# Patient Record
Sex: Male | Born: 1955 | Race: White | Hispanic: No | Marital: Married | Smoking: Former smoker
Health system: Southern US, Community
[De-identification: ages and names within clinical notes are randomized; demographics above are authoritative.]

## PROBLEM LIST (undated history)

## (undated) DIAGNOSIS — H409 Unspecified glaucoma: Secondary | ICD-10-CM

## (undated) DIAGNOSIS — E785 Hyperlipidemia, unspecified: Secondary | ICD-10-CM

## (undated) HISTORY — PX: MELANOMA EXCISION: SHX5266

## (undated) HISTORY — DX: Unspecified glaucoma: H40.9

## (undated) HISTORY — PX: HIP SURGERY: SHX245

## (undated) HISTORY — DX: Hyperlipidemia, unspecified: E78.5

---

## 2002-08-04 ENCOUNTER — Ambulatory Visit (HOSPITAL_COMMUNITY): Admission: RE | Admit: 2002-08-04 | Discharge: 2002-08-04 | Payer: Self-pay | Admitting: Orthopedic Surgery

## 2002-08-04 ENCOUNTER — Encounter: Payer: Self-pay | Admitting: Orthopedic Surgery

## 2007-06-18 ENCOUNTER — Inpatient Hospital Stay (HOSPITAL_COMMUNITY): Admission: RE | Admit: 2007-06-18 | Discharge: 2007-06-21 | Payer: Self-pay | Admitting: Orthopedic Surgery

## 2007-09-26 DIAGNOSIS — C439 Malignant melanoma of skin, unspecified: Secondary | ICD-10-CM

## 2007-09-26 HISTORY — DX: Malignant melanoma of skin, unspecified: C43.9

## 2009-08-20 ENCOUNTER — Ambulatory Visit (HOSPITAL_COMMUNITY): Admission: RE | Admit: 2009-08-20 | Discharge: 2009-08-20 | Payer: Self-pay | Admitting: Orthopedic Surgery

## 2011-01-11 NOTE — Op Note (Signed)
Jason Villegas, Jason Villegas                 ACCOUNT NO.:  [id number]   MEDICAL RECORD NO.:  [id number]          PATIENT TYPE:  INP   LOCATION:  5037                         FACILITY:  [facility]   PHYSICIAN:  Feliberto Gottron. Turner Daniels, M.D.   DATE OF BIRTH:  Jan 24, 1956   DATE OF PROCEDURE:  06/18/2007  DATE OF DISCHARGE:                               OPERATIVE REPORT   PREOPERATIVE DIAGNOSIS:  End-stage arthritis, right hip.   POSTOPERATIVE DIAGNOSIS:  End-stage arthritis, right hip.   PROCEDURE:  Right total hip arthroplasty using DePuy components, a 56-mm  ASR acetabular shell, NK +0 49-mm Ultramet ball, 18 x 13 x 160 x 42 SROM  stem, 18-D large cone.   SURGEON:  Feliberto Gottron. Turner Daniels, M.D.   FIRST ASSISTANT:  Erskine Squibb B. Su Hilt, P.A.-C.   ANESTHETIC:  General endotracheal.   ESTIMATED BLOOD LOSS:  300 mL.   FLUID REPLACEMENT:  1500 mL of crystalloid.   DRAINS PLACED:  Foley catheter.   URINE OUTPUT:  300 mL.   INDICATIONS FOR PROCEDURE:  A 55 year old man with x-ray-proven bone-on-  bone arthritic changes of his right hip.  He has failed conservative  treatment, anti-inflammatory medicines, physical therapy, activity  modification.  Cortisone shot intra-articularly under x-ray control gave  him temporary relief.  He now desires elective right total hip  arthroplasty to decrease pain and increase function.  Risks and benefits  of surgery are well-known to the patient.  Questions have been answered  preoperatively.   DESCRIPTION OF PROCEDURE:  The patient identified by armband, taken to  the operating room at [facility], where the appropriate  anesthetic monitors were attached and general endotracheal anesthesia  induced with the patient in supine position.  Rolled into the left  lateral decubitus position, fixed there with a Stulberg Mark II pelvic  clamp, axillary roll was placed, well-padded.  He received 1 g of  vancomycin preoperatively because of a PENICILLIN allergy and then the  right  lower extremity prepped and draped in the usual sterile fashion  from the ankle to the hemipelvis.  Skin along the lateral hip and thigh  infiltrated with 20 mL of 0.5% Marcaine and epinephrine solution, and we  began the procedure by making a posterolateral approach to the hip joint  utilizing an incision centered over the greater trochanter about 15 cm  in length, cutting through the skin and subcutaneous tissue down to the  level of the iliotibial band, which was cut in line with the skin  incision, exposing the greater trochanter.  Small bleeders were  identified and cauterized with electrocautery.  A Cobra retractor was  placed between the gluteus minimus and the superior hip joint capsule  and a second Cobra between the quadratus femoris and the inferior hip  joint capsule.  This isolated the piriformis and short external  rotators, which were then cut off their insertion on the  intertrochanteric crest using the electrocautery, and they were tagged  with a #2 Ethibond suture.  The hip joint capsule was then developed  into an acetabular-based flap going from posterior-superior to posterior-  inferior, going across the femoral neck and creating a flap of tissue,  which was likewise tagged with two #2 Ethibond sutures.  This exposed  the arthritic femoral head.  The hip was then flexed and internally  rotated, dislocating the femoral head, and the standard neck cut was  performed one fingerbreadth above the lesser trochanter.  The head was  down to bone-on-bone arthritic changes and it appeared to be somewhat  dysplastic in nature with a relatively broad femoral head and a shallow  acetabular cup.  We then placed a Hohmann retractor on the anterior  column and levered the proximal femur anteriorly, placed posterior-  superior and -inferior wing retractors in the periacetabular region,  isolating the acetabulum and the labrum.  The labrum was then removed  with the electrocautery.   Bleeders down around the cotyloid notch were  also cauterized and a Cobra, spiked, was placed in the cotyloid notch  region.  We then reamed up to 55 mm basket reamer, obtaining good  coverage in all quadrants.  Satisfied with this position, we then  hammered into place a 56 ASR cup in 45 degrees of abduction and about 20  degrees of anteversion.  Peripheral osteophytes were then removed and  the wound was once again irrigated out with normal saline solution.  The  femur was then flexed and internally rotated, exposing the proximal end,  which was entered with a box-cutting chisel from the SROM set, followed  by the initiating reamer.  We then sequentially reamed up to a 13.5  axial reamer to the depth just distal to the greater trochanter.  We  then conically reamed up to an 18-D cone and the calcar was milled up to  an 18-D large calcar.  The 18-D large cone trial was then hammered into  place, followed by a trial 18 stem with an NK +0, 42 neck and a 49-mm  ball.  The hip was reduced, easily came to full extension, could not be  dislocated anteriorly in external rotation and in flexion.  He had to  flex to 90 with 70 of internal rotation before any instability was  noted.  At this point the trial components were removed.  An 18-D large  ZTT1 cone was hammered into place.  We then again reamed with a 13.5  axial reamer to the appropriate depth, followed by the real 18 x 13 x 42  x 160 stem in the same version as the cone.  Once the stem had been  seated, an NK +0 49-mm Ultramet ball was hammered into place, the hip  was once again reduced and stability noted to be excellent.  The wound  was again irrigated out with normal saline solution, the small bleeders  identified and cauterized, and at this point the capsular flap and short  external rotators were repaired back to the intertrochanteric crest  through drill holes.  The iliotibial band was closed with running #1  Vicryl suture, the  subcutaneous tissue with 0 and 2-0 undyed Vicryl  suture, and the skin with running interlocking 3-0 nylon suture.  A  dressing of Xeroform, 4x4 dressing sponges and Hypafix tape was then  applied.  The patient was unclamped, laid supine, awakened and taken to  the recovery room without difficulty.      Feliberto Gottron. Turner Daniels, M.D.  Electronically Signed     FJR/MEDQ  D:  06/18/2007  T:  06/19/2007  Job:  [id number]

## 2011-01-14 NOTE — Discharge Summary (Signed)
NAMEGAELEN, Jason Villegas                 ACCOUNT NO.:  [id number]   MEDICAL RECORD NO.:  [id number]          PATIENT TYPE:  INP   LOCATION:  5037                         FACILITY:  [facility]   PHYSICIAN:  Feliberto Gottron. Turner Daniels, M.D.   DATE OF BIRTH:  April 05, 1956   DATE OF ADMISSION:  06/18/2007  DATE OF DISCHARGE:  06/21/2007                               DISCHARGE SUMMARY   DISCHARGE DIAGNOSIS:  End-stage degenerative joint disease of the right  hip.   PROCEDURE PERFORMED:  Procedure while in hospital:  Right total hip  arthroplasty.   HISTORY OF PRESENT ILLNESS:  The patient is a 55 year old man with x-  rays proving bone-on-bone arthritic changes of the right hip.  He has  failed conservative treatment including anti-inflammatory medications,  physical therapy, activity modification, cortisone intraarticular  injection which did by the way give him excellent temporary relief.  He  has now desires elective right total hip arthroplasty to decrease pain  and  dysfunction.  Risks and benefits were discussed.  The patient  wishes to proceed with surgery.  He is allergic to penicillin and sulfa.  He is currently taking Cosopt, Vytorin and Restasis.   PAST MEDICAL HISTORY:  Usual childhood diseases.  Adult illnesses  degenerative joint disease and glaucoma.   PAST SURGICAL HISTORY:  Colonoscopy and wisdom teeth extraction.   SOCIAL HISTORY:  No tobacco, positive for ethanol 2-3 times per week, no  heavy drug abuse.  He is married, works in Audiological scientist.   FAMILY HISTORY:  Mother died at 59 with a history of cancer. Father died  with a history of stroke.   REVIEW OF SYSTEMS:  Positive for glasses; denies any shortness of  breath, chest pain or recent illness or injuries.   PHYSICAL EXAMINATION:  VITAL SIGNS:  The patient's temperature is 97.3,  pulse is 64, blood pressure 134/86.  He is 6 feet  295 pound male.  HEENT:  Head is normocephalic, atraumatic.  His pupils are equal, round,  reactive to  light and accommodation.  Nose is clear.  Throat is benign.  NECK:  Supple, full range of motion.  CHEST:  Clear to auscultation and percussion.  HEART:  Regular rate and rhythm.  ABDOMEN:  Soft, nontender.  EXTREMITIES:  Showed a right hip range of motion 10 to 15 degrees of  internal/external rotation each,  with positive foot tap, forward  flexion of 100 degrees with pain.  Otherwise neurovascularly intact.   X-rays showed bone on bone  changes of the hip.  Preoperative labs  including CBC, CMET, chest x-ray, EKG, PT and PTT were within normal  limits.   HOSPITAL COURSE:  Hospital course on the day of admission:  The patient  was taken to the operating room at [facility]  where he underwent a  right total hip arthroplasty using DePuy S-ROM components with 56 mm ASR  acetabular shell, an NK +0 49 mm Ultima ball, 18 x 13 x 160 x 42 S-ROM  stem with an 18 D large cone. The Foley catheter was placed  perioperatively.  He was placed on  Coumadin prophylactically  postoperatively with bridging Lovenox until he became therapeutic.  Physical therapy was begun on the first postoperative day.  He was  placed on a PCA Dilaudid pump for pain control.   On postoperative day #1, the patient was awake and alert, moderate pain,  no nausea or vomiting.  He was taking p.o. well.  Vital signs were  stable.  He was afebrile.  Wounds were clean and dry.  He is  neurovascularly intact.  Hemoglobin 13.1, PT 15.3. Urine output was 600  q.shift, otherwise stable.  Physical therapy continued.   It was felt that the patient would be able to go directly home with some  assistance.   On postoperative day #2, the patient was complaining of moderate pain.  He was afebrile with hemoglobin of 13.1, WBC of 11.4, INR of 2.0.  Dressing  was dry, the wound was benign,  otherwise stable.  Continue  with physical therapy where he was making rapid progress.   On postoperative day #3, the patient was awake and  alert, decreased  pain.  He was already walking the hallway, taking p.o.'s well.  Hemoglobin was 12.1, WBC of 10.4, hematocrit of 100.2.  The wound was  otherwise clean and dry.   ASSESSMENT:  Satisfactory postoperative course after right total hip  arthroplasty with elevated INR.   PLAN:  The patient will hold Coumadin dose today.  He has met his  physical therapy  goals and will be discharged home to the care of his  family as well as [employer].  We will monitor his home  Coumadin levels.  He will also be given prescriptions for Percocet and  Robaxin and he will restart his home medications as per the home  medication reconciliation sheet.  Diet is regular.  Activity is  weightbearing as tolerated, right lower extremity.  Total hip  precautions using a walker.  He is to return to see [place] in one week's  time for followup check or  sooner should he have any difficulties with  drainage of the wound, increased pain or pain accompanied by drainage.  At the time of discharge, he is medically stable and orthopedically  improved.   FINAL DIAGNOSES:  End-stage degenerative joint disease right hip.      Laural Benes. Jannet Mantis.      Feliberto Gottron. Turner Daniels, M.D.  Electronically Signed    JBR/MEDQ  D:  08/26/2007  T:  08/27/2007  Job:  [id number]

## 2011-06-08 LAB — CBC
HCT: 36.8 — ABNORMAL LOW
HCT: 37.4 — ABNORMAL LOW
HCT: 37.9 — ABNORMAL LOW
Hemoglobin: 12.6 — ABNORMAL LOW
Hemoglobin: 13.1
Hemoglobin: 13.1
MCHC: 34.5
MCHC: 35
MCV: 91
MCV: 92.5
Platelets: 137 — ABNORMAL LOW
Platelets: 154
RBC: 4.16 — ABNORMAL LOW
RDW: 12.8
RDW: 13.1
RDW: 13.1
WBC: 10.4
WBC: 12.2 — ABNORMAL HIGH

## 2011-06-09 LAB — URINALYSIS, ROUTINE W REFLEX MICROSCOPIC
Bilirubin Urine: NEGATIVE
Glucose, UA: NEGATIVE
Hgb urine dipstick: NEGATIVE
Specific Gravity, Urine: 1.017
pH: 6

## 2011-06-09 LAB — COMPREHENSIVE METABOLIC PANEL
AST: 25
BUN: 17
CO2: 29
Calcium: 9.7
Creatinine, Ser: 1.31
GFR calc Af Amer: >60
GFR calc non Af Amer: 58 — ABNORMAL LOW
Total Bilirubin: 0.9

## 2011-06-09 LAB — DIFFERENTIAL
Basophils Absolute: 0
Eosinophils Relative: 3
Lymphocytes Relative: 24
Lymphs Abs: 1.7
Neutro Abs: 4.5
Neutrophils Relative %: 63

## 2011-06-09 LAB — CBC
HCT: 46.7
MCHC: 34
MCV: 92.9
RBC: 5.03

## 2011-06-09 LAB — PROTIME-INR
INR: 1
Prothrombin Time: 13.7

## 2011-06-09 LAB — TYPE AND SCREEN: Antibody Screen: NEGATIVE

## 2011-06-09 LAB — ABO/RH: ABO/RH(D): O POS

## 2011-06-09 LAB — APTT: aPTT: 28

## 2012-12-10 ENCOUNTER — Ambulatory Visit (INDEPENDENT_AMBULATORY_CARE_PROVIDER_SITE_OTHER): Payer: BC Managed Care – PPO | Admitting: Family Medicine

## 2012-12-10 ENCOUNTER — Encounter: Payer: Self-pay | Admitting: Family Medicine

## 2012-12-10 VITALS — BP 123/80 | HR 73 | Ht 74.0 in | Wt 190.0 lb

## 2012-12-10 DIAGNOSIS — M25529 Pain in unspecified elbow: Secondary | ICD-10-CM

## 2012-12-10 DIAGNOSIS — M25521 Pain in right elbow: Secondary | ICD-10-CM

## 2012-12-10 NOTE — Patient Instructions (Addendum)
You have a Grade 1 extensor tendon strain at your elbow. Rest for 3-5 more days before starting the strengthening exercises (1 pound/soup can exercise, hammer exercise - 3 sets of 10 once a day of both). Aleve 2 tabs twice a day with food OR ibuprofen 3 tabs three times a day with food for pain and inflammation. Ok to take tylenol in addition to this. Ice elbow 15 minutes at a time 3-4 times a day. Compression sleeve may be beneficial but will also limit your motion at the elbow. After a week start easy hitting against a wall including 5-10 overhead hits - as long as pain is 1-2/10 you're ok to advance how many balls you hit each day without increasing risk of reinjury. Can consider formal physical therapy and/or nitro patches if not improving. Follow up with me in 4 weeks for reevaluation.

## 2012-12-11 ENCOUNTER — Encounter: Payer: Self-pay | Admitting: Family Medicine

## 2012-12-11 NOTE — Progress Notes (Signed)
  Subjective:    Patient ID: Jason Villegas, male    DOB: Jan 14, 1956, 57 y.o.   MRN: [id number]  PCP: Dr. Zachery Dauer  HPI 57 yo M here for right elbow pain.  Patient reports 2 days ago he was playing tennis. He went to do a 2 hand backhand and felt a pull near elbow laterally. Was able to continue playing. However when serving would hurt really bad (overhead). Has been icing, taking ibuprofen since. Has improved over past couple days. No prior injuries to right elbow. No swelling or bruising. Motions of wrist, brushing teeth bothers him at elbow. Right handed.  Past Medical History  Diagnosis Date  . Hyperlipidemia   . Glaucoma     No current outpatient prescriptions on file prior to visit.   No current facility-administered medications on file prior to visit.    Past Surgical History  Procedure Laterality Date  . Hip surgery Right     hip replacement  . Melanoma excision      Allergies  Allergen Reactions  . Penicillins   . Sulfa Antibiotics     History   Social History  . Marital Status: Married    Spouse Name: N/A    Number of Children: N/A  . Years of Education: N/A   Occupational History  . Not on file.   Social History Main Topics  . Smoking status: Never Smoker   . Smokeless tobacco: Not on file  . Alcohol Use: Not on file  . Drug Use: Not on file  . Sexually Active: Not on file   Other Topics Concern  . Not on file   Social History Narrative  . No narrative on file    Family History  Problem Relation Age of Onset  . Heart attack Father   . Hyperlipidemia Brother   . Hypertension Brother   . Diabetes Neg Hx   . Sudden death Neg Hx     BP 123/80  Pulse 73  Ht 6\' 2"  (1.88 m)  Wt 190 lb (86.183 kg)  BMI 24.38 kg/m2  Review of Systems See HPI above.    Objective:   Physical Exam Gen: NAD  R elbow: No gross deformity, swelling, bruising. TTP distal posterior to lateral epicondyle reproducing his pain.  No lateral epicondyle  tenderness.  No defect palpable in muscle.  No other TTP about elbow. FROM but with pain on 3rd finger extension, wrist extension.   Collateral ligaments intact. NVI distally.    Assessment & Plan:  1. Right elbow pain - consistent with common extensor tendon strain.  Treat similar to lateral epicondylitis but with a short period of rest initially.  Elbow sleeve for compression.  Icing, nsaids, tylenol.  Slowly return back to tennis (discussed return to play protocol after he's a week out from injury if pain < 3/10).  Home exercises demonstrated to start at that time as well.  F/u in 4 weeks for reevaluation. Consider formal PT, nitro patches if not improving as expected.

## 2012-12-12 DIAGNOSIS — M25521 Pain in right elbow: Secondary | ICD-10-CM | POA: Insufficient documentation

## 2012-12-12 NOTE — Assessment & Plan Note (Signed)
consistent with common extensor tendon strain.  Treat similar to lateral epicondylitis but with a short period of rest initially.  Elbow sleeve for compression.  Icing, nsaids, tylenol.  Slowly return back to tennis (discussed return to play protocol after he's a week out from injury if pain < 3/10).  Home exercises demonstrated to start at that time as well.  F/u in 4 weeks for reevaluation. Consider formal PT, nitro patches if not improving as expected.

## 2013-01-07 ENCOUNTER — Ambulatory Visit: Payer: BC Managed Care – PPO | Admitting: Family Medicine

## 2013-01-28 ENCOUNTER — Ambulatory Visit (INDEPENDENT_AMBULATORY_CARE_PROVIDER_SITE_OTHER): Payer: BC Managed Care – PPO | Admitting: Family Medicine

## 2013-01-28 ENCOUNTER — Encounter: Payer: Self-pay | Admitting: Family Medicine

## 2013-01-28 VITALS — BP 119/74 | HR 73 | Ht 74.0 in | Wt 185.0 lb

## 2013-01-28 DIAGNOSIS — M25529 Pain in unspecified elbow: Secondary | ICD-10-CM

## 2013-01-28 DIAGNOSIS — M25521 Pain in right elbow: Secondary | ICD-10-CM

## 2013-01-28 MED ORDER — NITROGLYCERIN 0.2 MG/HR TD PT24: MEDICATED_PATCH | TRANSDERMAL | Status: DC

## 2013-01-28 NOTE — Patient Instructions (Addendum)
Only use aleve as needed for pain and inflammation. Do home exercises (hammer, wrist extensions) 3 sets of 10 once a day. Ice elbow 15 minutes at a time 3-4 times a day as needed. Compression sleeve only with sports now. Use nitro patch - 1/4th of a patch and change this every day. Follow up with me in 6 weeks for reevaluation.

## 2013-01-29 ENCOUNTER — Encounter: Payer: Self-pay | Admitting: Family Medicine

## 2013-01-29 NOTE — Progress Notes (Signed)
  Subjective:    Patient ID: Jason Villegas, male    DOB: 1956-08-02, 57 y.o.   MRN: [id number]  PCP: Dr. Zachery Dauer  HPI  57 yo M here for f/u right elbow pain.  4/14: Patient reports 2 days ago he was playing tennis. He went to do a 2 hand backhand and felt a pull near elbow laterally. Was able to continue playing. However when serving would hurt really bad (overhead). Has been icing, taking ibuprofen since. Has improved over past couple days. No prior injuries to right elbow. No swelling or bruising. Motions of wrist, brushing teeth bothers him at elbow. Right handed.  6/2: Patient returns stating he feels 80% better with normal activities from last visit. Not doing exercises regularly. Does wear sleeve when playing tennis - felt worse with a one-handed backhand. Able to serve, hit other shots. Stopped taking aleve regularly. Has been icing.  Past Medical History  Diagnosis Date  . Hyperlipidemia   . Glaucoma     Current Outpatient Prescriptions on File Prior to Visit  Medication Sig Dispense Refill  . dorzolamide-timolol (COSOPT) 22.3-6.8 MG/ML ophthalmic solution 1 drop 2 (two) times daily.      Marland Kitchen ezetimibe-simvastatin (VYTORIN) 10-40 MG per tablet Take 1 tablet by mouth at bedtime.       No current facility-administered medications on file prior to visit.    Past Surgical History  Procedure Laterality Date  . Hip surgery Right     hip replacement  . Melanoma excision      Allergies  Allergen Reactions  . Penicillins   . Sulfa Antibiotics     History   Social History  . Marital Status: Married    Spouse Name: N/A    Number of Children: N/A  . Years of Education: N/A   Occupational History  . Not on file.   Social History Main Topics  . Smoking status: Never Smoker   . Smokeless tobacco: Not on file  . Alcohol Use: Not on file  . Drug Use: Not on file  . Sexually Active: Not on file   Other Topics Concern  . Not on file   Social History  Narrative  . No narrative on file    Family History  Problem Relation Age of Onset  . Heart attack Father   . Hyperlipidemia Brother   . Hypertension Brother   . Diabetes Neg Hx   . Sudden death Neg Hx     BP 119/74  Pulse 73  Ht 6\' 2"  (1.88 m)  Wt 185 lb (83.915 kg)  BMI 23.74 kg/m2  Review of Systems  See HPI above.    Objective:   Physical Exam  Gen: NAD  R elbow: No gross deformity, swelling, bruising. Very mild TTP distal posterior to lateral epicondyle reproducing his pain.  No lateral epicondyle tenderness.  No defect palpable in muscle.  No other TTP about elbow. FROM but with pain on 3rd finger extension, wrist extension.   Collateral ligaments intact. NVI distally.    Assessment & Plan:  1. Right elbow pain - consistent with common extensor tendon strain, tendinopathy.  Encouraged to do exercises daily as he hasn't been doing regularly.  Add nitro patches - discussed headache risk, adhesive reaction.  Elbow sleeve for compression.  Icing, nsaids, tylenol.  F/u in 6 weeks for reevaluation.

## 2013-01-29 NOTE — Assessment & Plan Note (Signed)
consistent with common extensor tendon strain, tendinopathy.  Encouraged to do exercises daily as he hasn't been doing regularly.  Add nitro patches - discussed headache risk, adhesive reaction.  Elbow sleeve for compression.  Icing, nsaids, tylenol.  F/u in 6 weeks for reevaluation.

## 2013-03-11 ENCOUNTER — Encounter: Payer: Self-pay | Admitting: Family Medicine

## 2013-03-11 ENCOUNTER — Ambulatory Visit (INDEPENDENT_AMBULATORY_CARE_PROVIDER_SITE_OTHER): Payer: BC Managed Care – PPO | Admitting: Family Medicine

## 2013-03-11 VITALS — BP 114/76 | HR 63 | Ht 74.0 in | Wt 189.0 lb

## 2013-03-11 DIAGNOSIS — M25529 Pain in unspecified elbow: Secondary | ICD-10-CM

## 2013-03-11 DIAGNOSIS — M25521 Pain in right elbow: Secondary | ICD-10-CM

## 2013-03-12 ENCOUNTER — Encounter: Payer: Self-pay | Admitting: Family Medicine

## 2013-03-12 NOTE — Patient Instructions (Addendum)
Discussed physical therapy, injection.  Wants to stick with home exercises and increase nitro patch to 1/2 patch.

## 2013-03-12 NOTE — Progress Notes (Signed)
Subjective:    Patient ID: Jason Villegas, male    DOB: July 17, 1956, 57 y.o.   MRN: [id number]  PCP: Dr. Zachery Dauer  HPI  57 yo M here for f/u right elbow pain.  4/14: Patient reports 2 days ago he was playing tennis. He went to do a 2 hand backhand and felt a pull near elbow laterally. Was able to continue playing. However when serving would hurt really bad (overhead). Has been icing, taking ibuprofen since. Has improved over past couple days. No prior injuries to right elbow. No swelling or bruising. Motions of wrist, brushing teeth bothers him at elbow. Right handed.  6/2: Patient returns stating he feels 80% better with normal activities from last visit. Not doing exercises regularly. Does wear sleeve when playing tennis - felt worse with a one-handed backhand. Able to serve, hit other shots. Stopped taking aleve regularly. Has been icing.  7/14: Patient reports he feels marginally better than last visit. Did home exercises more regularly for 3 1/2 weeks before trying tennis - bothered him a lot with this so stopped playing. Using nitro patches as well. No longer bothers him with daily activities (brushing teeth, opening doors).   Past Medical History  Diagnosis Date  . Hyperlipidemia   . Glaucoma     Current Outpatient Prescriptions on File Prior to Visit  Medication Sig Dispense Refill  . ciprofloxacin (CIPRO) 500 MG tablet       . diphenoxylate-atropine (LOMOTIL) 2.5-0.025 MG per tablet       . dorzolamide-timolol (COSOPT) 22.3-6.8 MG/ML ophthalmic solution 1 drop 2 (two) times daily.      Marland Kitchen ezetimibe-simvastatin (VYTORIN) 10-40 MG per tablet Take 1 tablet by mouth at bedtime.      . nitroGLYCERIN (NITRODUR - DOSED IN MG/24 HR) 0.2 mg/hr 1/4th patch over affected elbow - change daily  30 patch  1   No current facility-administered medications on file prior to visit.    Past Surgical History  Procedure Laterality Date  . Hip surgery Right     hip replacement  .  Melanoma excision      Allergies  Allergen Reactions  . Penicillins   . Sulfa Antibiotics     History   Social History  . Marital Status: Married    Spouse Name: N/A    Number of Children: N/A  . Years of Education: N/A   Occupational History  . Not on file.   Social History Main Topics  . Smoking status: Never Smoker   . Smokeless tobacco: Not on file  . Alcohol Use: Not on file  . Drug Use: Not on file  . Sexually Active: Not on file   Other Topics Concern  . Not on file   Social History Narrative  . No narrative on file    Family History  Problem Relation Age of Onset  . Heart attack Father   . Hyperlipidemia Brother   . Hypertension Brother   . Diabetes Neg Hx   . Sudden death Neg Hx     BP 114/76  Pulse 63  Ht 6\' 2"  (1.88 m)  Wt 189 lb (85.73 kg)  BMI 24.26 kg/m2  Review of Systems  See HPI above.    Objective:   Physical Exam  Gen: NAD  R elbow: No gross deformity, swelling, bruising. Mild TTP lateral epicondyle reproducing his pain.  No defect palpable in muscle.  No other TTP about elbow. FROM but with pain on 3rd finger extension, wrist extension.  Collateral ligaments intact. NVI distally.    Assessment & Plan:  1. Right elbow pain - pain location focal now, c/w lateral epicondylitis.  He would like to continue with home exercises, increase nitro patches to 1/2 patch (worked well for his wife who had same issue).  Declined injection, formal PT for now but will call us if he'd like to go forward with either of these.  Otherwise he plans to follow-up as needed.

## 2013-03-12 NOTE — Assessment & Plan Note (Signed)
pain location focal now, c/w lateral epicondylitis.  He would like to continue with home exercises, increase nitro patches to 1/2 patch (worked well for his wife who had same issue).  Declined injection, formal PT for now but will call us if he'd like to go forward with either of these.  Otherwise he plans to follow-up as needed.

## 2013-07-03 ENCOUNTER — Other Ambulatory Visit: Payer: Self-pay | Admitting: *Deleted

## 2013-07-03 ENCOUNTER — Other Ambulatory Visit: Payer: Self-pay | Admitting: Family Medicine

## 2013-07-03 DIAGNOSIS — M25521 Pain in right elbow: Secondary | ICD-10-CM

## 2013-07-03 MED ORDER — NITROGLYCERIN 0.2 MG/HR TD PT24: MEDICATED_PATCH | TRANSDERMAL | Status: DC

## 2013-07-04 ENCOUNTER — Other Ambulatory Visit: Payer: Self-pay

## 2014-03-19 ENCOUNTER — Ambulatory Visit (INDEPENDENT_AMBULATORY_CARE_PROVIDER_SITE_OTHER): Payer: BC Managed Care – PPO | Admitting: Family Medicine

## 2014-03-19 ENCOUNTER — Encounter: Payer: Self-pay | Admitting: Family Medicine

## 2014-03-19 VITALS — BP 133/81 | HR 73 | Ht 74.0 in | Wt 202.0 lb

## 2014-03-19 DIAGNOSIS — M25531 Pain in right wrist: Secondary | ICD-10-CM

## 2014-03-19 DIAGNOSIS — M25539 Pain in unspecified wrist: Secondary | ICD-10-CM

## 2014-03-19 NOTE — Patient Instructions (Signed)
You have extensor tendinitis of your right wrist. Icing 15 minutes at a time 3-4 times a day. Ibuprofen 600mg  three times a day with food OR aleve 2 tabs twice a day with food for pain and inflammation for at least 10 days then as needed. Wear wrist brace as often as possible over the next 6 weeks. Follow up with me as needed. I would recommend taking 2 weeks off from tennis in the meantime.

## 2014-03-20 ENCOUNTER — Encounter: Payer: Self-pay | Admitting: Family Medicine

## 2014-03-20 DIAGNOSIS — M25531 Pain in right wrist: Secondary | ICD-10-CM | POA: Insufficient documentation

## 2014-03-20 NOTE — Progress Notes (Signed)
Patient ID: Jason Villegas, male   DOB: 27-Mar-1956, 58 y.o.   MRN: [id number]  PCP: No primary provider on file.  Subjective:   HPI: Patient is a 58 y.o. male here for right wrist pain.  Patient reports he was playing tennis on 7/14. No acute injury with this and no pain during match. Developed pain after the match in dorsal wrist joint. No swelling or bruising. No prior injuries. Rested and this improved. Then on Monday playing tennis again pain worsened especially with forehand and snapping motions of wrist. Is right handed.  Past Medical History  Diagnosis Date  . Hyperlipidemia   . Glaucoma     Current Outpatient Prescriptions on File Prior to Visit  Medication Sig Dispense Refill  . ciprofloxacin (CIPRO) 500 MG tablet       . diphenoxylate-atropine (LOMOTIL) 2.5-0.025 MG per tablet       . dorzolamide-timolol (COSOPT) 22.3-6.8 MG/ML ophthalmic solution 1 drop 2 (two) times daily.      Marland Kitchen ezetimibe-simvastatin (VYTORIN) 10-40 MG per tablet Take 1 tablet by mouth at bedtime.      . nitroGLYCERIN (NITRODUR - DOSED IN MG/24 HR) 0.2 mg/hr patch 1/4th patch over affected elbow - change daily  30 patch  1   No current facility-administered medications on file prior to visit.    Past Surgical History  Procedure Laterality Date  . Hip surgery Right     hip replacement  . Melanoma excision      Allergies  Allergen Reactions  . Penicillins   . Sulfa Antibiotics     History   Social History  . Marital Status: Married    Spouse Name: N/A    Number of Children: N/A  . Years of Education: N/A   Occupational History  . Not on file.   Social History Main Topics  . Smoking status: Never Smoker   . Smokeless tobacco: Not on file  . Alcohol Use: Not on file  . Drug Use: Not on file  . Sexual Activity: Not on file   Other Topics Concern  . Not on file   Social History Narrative  . No narrative on file    Family History  Problem Relation Age of Onset  . Heart  attack Father   . Hyperlipidemia Brother   . Hypertension Brother   . Diabetes Neg Hx   . Sudden death Neg Hx     BP 133/81  Pulse 73  Ht 6\' 2"  (1.88 m)  Wt 202 lb (91.627 kg)  BMI 25.92 kg/m2  Review of Systems: See HPI above.    Objective:  Physical Exam:  Gen: NAD  Right wrist/hand: No gross deformity, swelling, bruising. TTP dorsal wrist over extensor tendons, not as much at wrist joint.  No tenderness at TFCC. FROM wrist with pain on resisted extension and pain on extension of digits. NVI distally    Assessment & Plan:  1. Right wrist pain - 2/2 extensor tendinitis.  No injury or exam findings to warrant radiographs.  Discussed icing, nsaids, wrist brace for next 6 weeks.  Discussed slow progression back to tennis as tolerated.  F/u prn.

## 2014-03-20 NOTE — Assessment & Plan Note (Signed)
2/2 extensor tendinitis.  No injury or exam findings to warrant radiographs.  Discussed icing, nsaids, wrist brace for next 6 weeks.  Discussed slow progression back to tennis as tolerated.  F/u prn.

## 2014-09-24 ENCOUNTER — Encounter: Payer: Self-pay | Admitting: Family Medicine

## 2014-09-24 ENCOUNTER — Ambulatory Visit (HOSPITAL_BASED_OUTPATIENT_CLINIC_OR_DEPARTMENT_OTHER)
Admission: RE | Admit: 2014-09-24 | Discharge: 2014-09-24 | Disposition: A | Discharge status: Home / Self Care | Payer: BLUE CROSS/BLUE SHIELD | Source: Ambulatory Visit | Attending: Family Medicine | Admitting: Family Medicine

## 2014-09-24 ENCOUNTER — Ambulatory Visit (INDEPENDENT_AMBULATORY_CARE_PROVIDER_SITE_OTHER): Payer: BLUE CROSS/BLUE SHIELD | Admitting: Family Medicine

## 2014-09-24 VITALS — BP 117/76 | HR 99 | Ht 74.0 in | Wt 205.0 lb

## 2014-09-24 DIAGNOSIS — M25531 Pain in right wrist: Secondary | ICD-10-CM | POA: Diagnosis not present

## 2014-09-24 NOTE — Patient Instructions (Signed)
Get x-rays of your wrist as you leave today. We will call you with these results. Assuming these are normal, I would recommend doing an MRI arthrogram of the wrist to assess for a TFCC tear or synovial cyst.

## 2014-09-29 NOTE — Assessment & Plan Note (Signed)
concerning that he's continued to struggle with wrist pain for 7 months and not improved despite rest, wrist brace as would expect from  extensor tendinitis.  Radiographs negative - will go ahead with MR arthrogram to assess for TFCC tear.

## 2014-09-29 NOTE — Progress Notes (Addendum)
Patient ID: Jason Villegas, male   DOB: September 20, 1955, 59 y.o.   MRN: [id number]  PCP: No primary care provider on file.  Subjective:   HPI: Patient is a 59 y.o. male here for right wrist pain.  03/19/14: Patient reports he was playing tennis on 7/14. No acute injury with this and no pain during match. Developed pain after the match in dorsal wrist joint. No swelling or bruising. No prior injuries. Rested and this improved. Then on Monday playing tennis again pain worsened especially with forehand and snapping motions of wrist. Is right handed.  09/24/14: Patient reports he's continued to struggle with right wrist pain despite extensive rest. Used wrist brace. Flexion feels limited. Also can't bend wrist back very much. No new injuries or trauma.  Past Medical History  Diagnosis Date  . Hyperlipidemia   . Glaucoma     Current Outpatient Prescriptions on File Prior to Visit  Medication Sig Dispense Refill  . diphenoxylate-atropine (LOMOTIL) 2.5-0.025 MG per tablet     . dorzolamide-timolol (COSOPT) 22.3-6.8 MG/ML ophthalmic solution 1 drop 2 (two) times daily.     No current facility-administered medications on file prior to visit.    Past Surgical History  Procedure Laterality Date  . Hip surgery Right     hip replacement  . Melanoma excision      Allergies  Allergen Reactions  . Penicillins   . Sulfa Antibiotics     History   Social History  . Marital Status: Married    Spouse Name: N/A    Number of Children: N/A  . Years of Education: N/A   Occupational History  . Not on file.   Social History Main Topics  . Smoking status: Never Smoker   . Smokeless tobacco: Not on file  . Alcohol Use: Not on file  . Drug Use: Not on file  . Sexual Activity: Not on file   Other Topics Concern  . Not on file   Social History Narrative    Family History  Problem Relation Age of Onset  . Heart attack Father   . Hyperlipidemia Brother   . Hypertension Brother    . Diabetes Neg Hx   . Sudden death Neg Hx     BP 117/76 mmHg  Pulse 99  Ht 6\' 2"  (1.88 m)  Wt 205 lb (92.987 kg)  BMI 26.31 kg/m2  Review of Systems: See HPI above.    Objective:  Physical Exam:  Gen: NAD  Right wrist/hand: No gross deformity, swelling, bruising. TTP dorsal wrist mildly over extensor tendons, TFCC.  No other tenderness. Does lack about 10 degrees extension now - worse than last visit.  Pain resisted extension of wrist but not digits. NVI distally.    Assessment & Plan:  1. Right wrist pain - concerning that he's continued to struggle with wrist pain for 7 months and not improved despite rest, wrist brace as would expect from  extensor tendinitis.  Radiographs negative - will go ahead with MR arthrogram to assess for TFCC tear.  Addendum:  MR arthrogram of right wrist reviewed and discussed with patient.  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  Uncertain if these are large enough to warrant surgical intervention - advised we go ahead with hand surgery referral for their input on continued conservative treatment, occupational therapy vs operative intervention.

## 2014-10-10 ENCOUNTER — Ambulatory Visit
Admission: RE | Admit: 2014-10-10 | Discharge: 2014-10-10 | Disposition: A | Discharge status: Home / Self Care | Payer: BLUE CROSS/BLUE SHIELD | Source: Ambulatory Visit | Attending: Family Medicine | Admitting: Family Medicine

## 2014-10-10 DIAGNOSIS — M25531 Pain in right wrist: Secondary | ICD-10-CM

## 2014-10-10 MED ORDER — IOHEXOL 180 MG/ML  SOLN
1.5000 mL | Freq: Once | INTRAMUSCULAR | Status: AC | PRN
  Administered 2014-10-10: 1.5 mL via INTRA_ARTICULAR

## 2014-10-16 NOTE — Addendum Note (Signed)
Addended by: Sherrie George F on: 10/16/2014 09:22 AM   Modules accepted: Orders

## 2014-11-10 ENCOUNTER — Telehealth: Payer: Self-pay | Admitting: Family Medicine

## 2014-11-10 NOTE — Telephone Encounter (Signed)
Spoke to patient and informed him that [facility] can view x-rays/MRIs through the [facility]. Also informed patient that if he wanted to he could come by the radiology department and pick up a CD. Patient stated he would come in to pick up CD.

## 2015-12-02 DIAGNOSIS — J029 Acute pharyngitis, unspecified: Secondary | ICD-10-CM | POA: Diagnosis not present

## 2015-12-02 DIAGNOSIS — L309 Dermatitis, unspecified: Secondary | ICD-10-CM | POA: Diagnosis not present

## 2016-02-17 DIAGNOSIS — Z85820 Personal history of malignant melanoma of skin: Secondary | ICD-10-CM | POA: Diagnosis not present

## 2016-02-17 DIAGNOSIS — L309 Dermatitis, unspecified: Secondary | ICD-10-CM | POA: Diagnosis not present

## 2016-02-17 DIAGNOSIS — D239 Other benign neoplasm of skin, unspecified: Secondary | ICD-10-CM | POA: Diagnosis not present

## 2016-07-13 IMAGING — MR MR WRIST*R* W/ CM
4 of 5 series · 21 of 40 positions shown · IV contrast (agent unspecified)
Comparison: None.

CLINICAL DATA: Right wrist pain since playing tennis 7 months ago.

EXAM:
MRI OF THE RIGHT WRIST WITH CONTRAST(MR Arthrogram)
TECHNIQUE: Multiplanar, multisequence MR imaging of the wrist was performed
immediately following contrast injection into the radiocarpal joint
under fluoroscopic guidance. No intravenous contrast was
administered.

[Series 4: T2 fat-sat · axial · 3.5mm · 0.23mm/px · z∈[-59,+14]mm · 8 of 20 slices shown (1 of 2)]
[im 1/20]
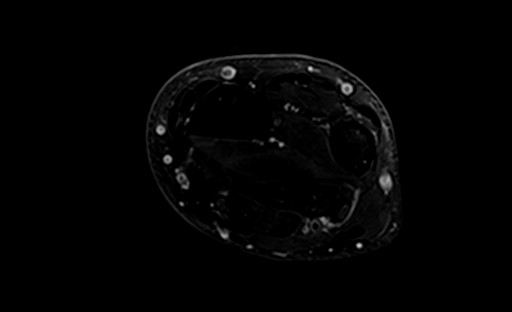
[im 3/20]
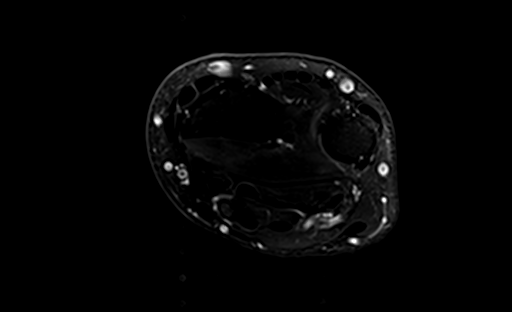
[im 7/20]
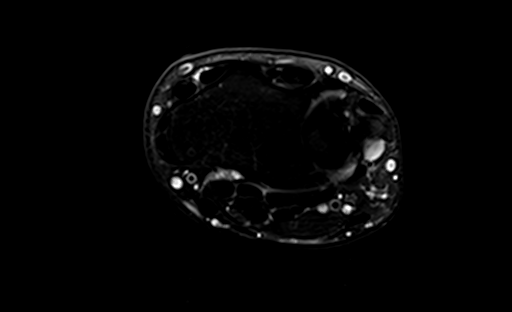
[im 9/20]
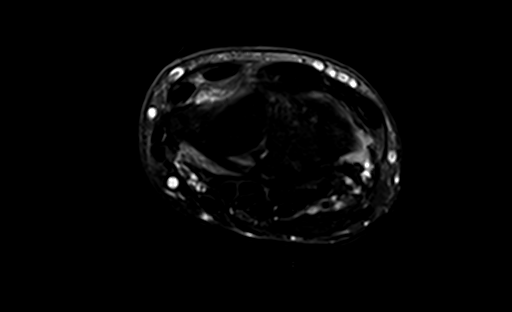
[im 11/20]
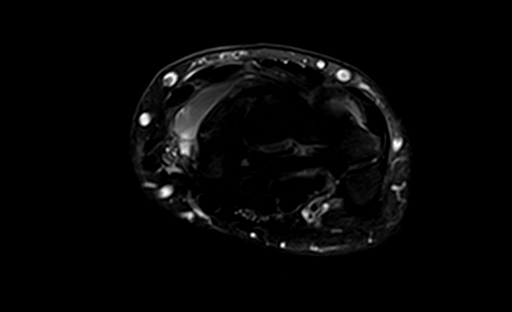
[im 13/20]
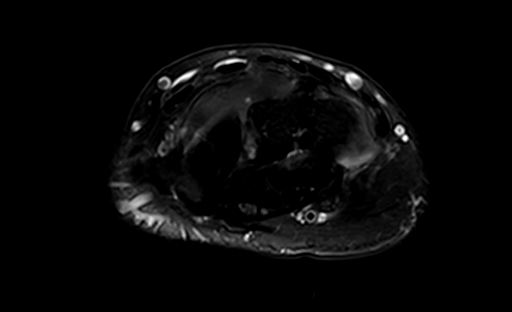
[im 17/20]
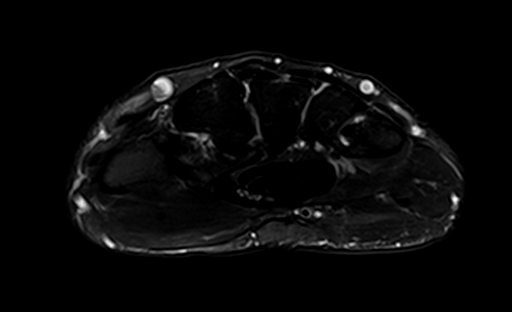
[im 20/20]
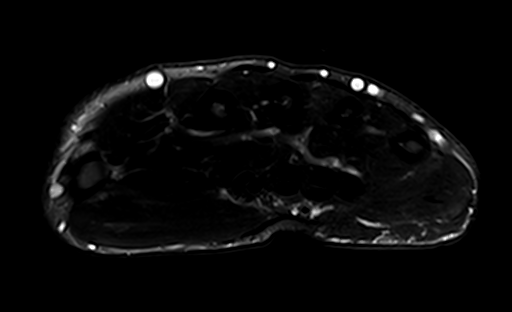

[Series 5: T1 fat-sat · coronal · 3.0mm · 0.23mm/px · 3 of 14 slices shown]
[im 3/14]
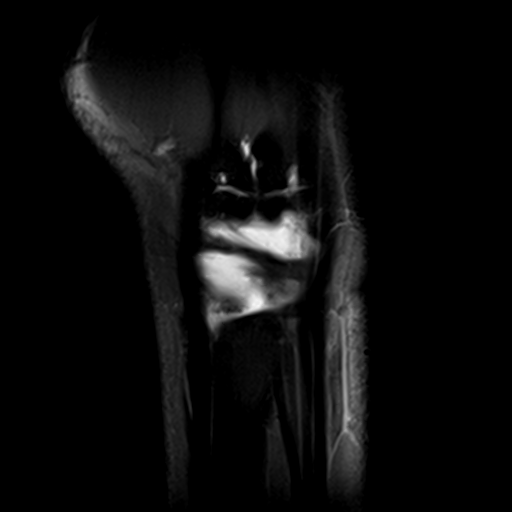
[im 7/14]
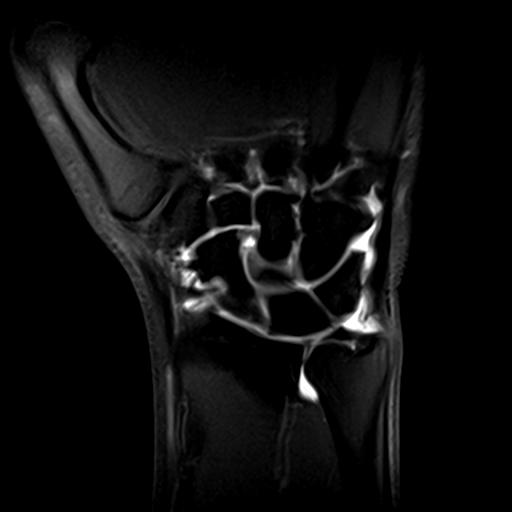
[im 11/14]
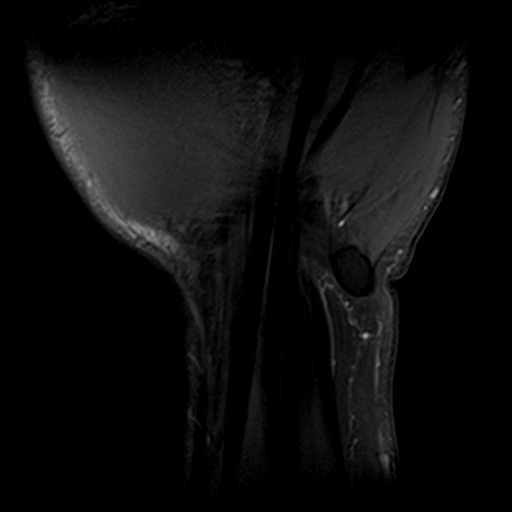

[Series 7: T1 · coronal · 3.0mm · 0.23mm/px · 3 of 14 slices shown]
[im 3/14]
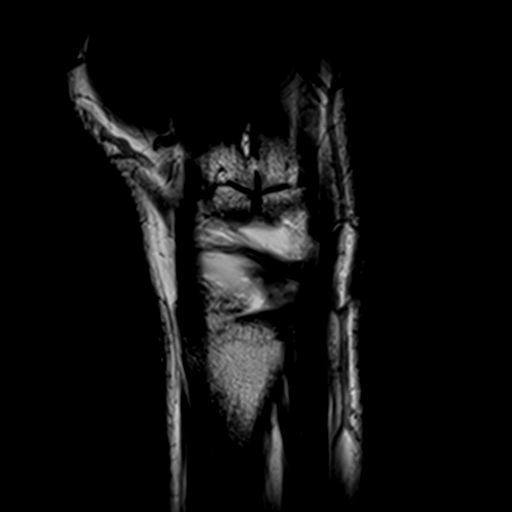
[im 7/14]
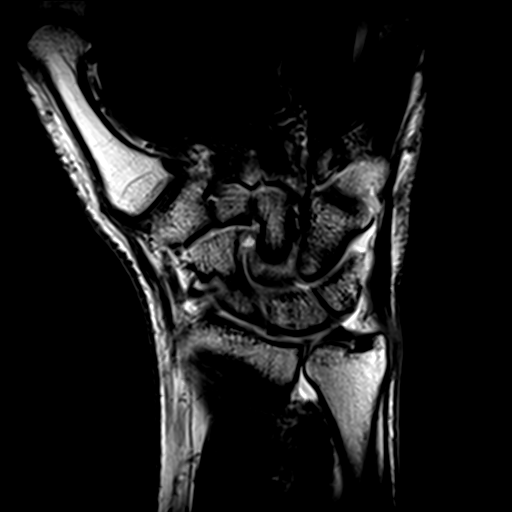
[im 11/14]
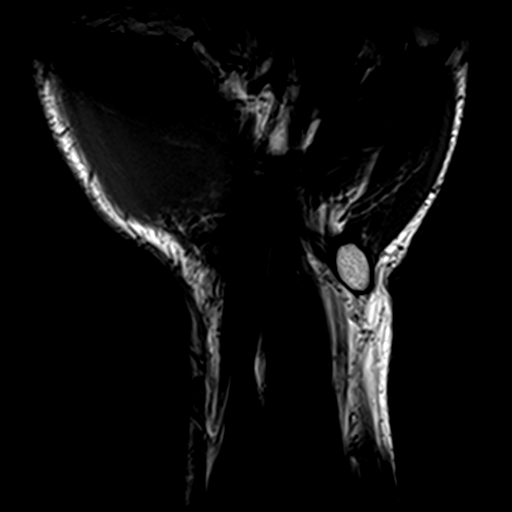

[Series 10: T2 fat-sat · coronal · 3.0mm · 0.23mm/px · 7 of 14 slices shown (2 of 2)]
[im 1/14]
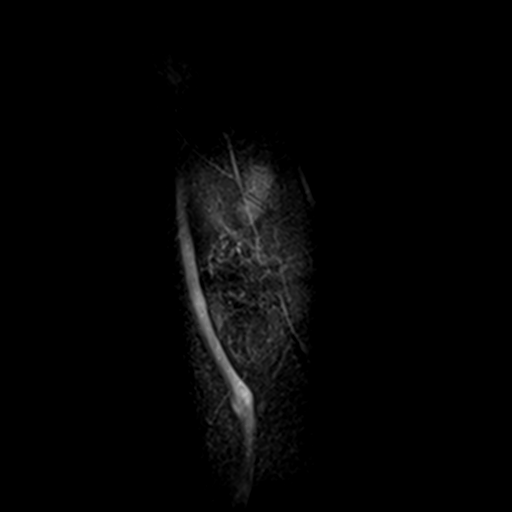
[im 3/14]
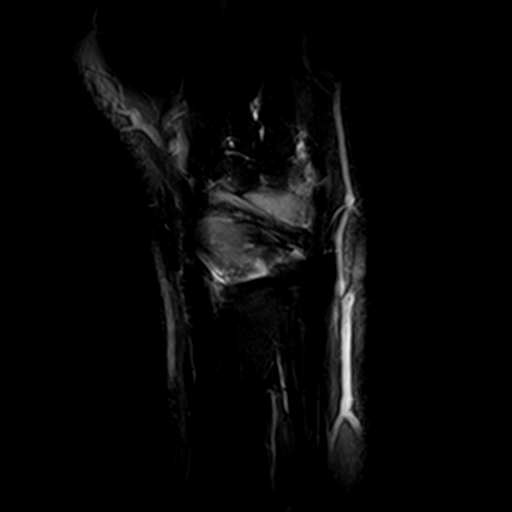
[im 5/14]
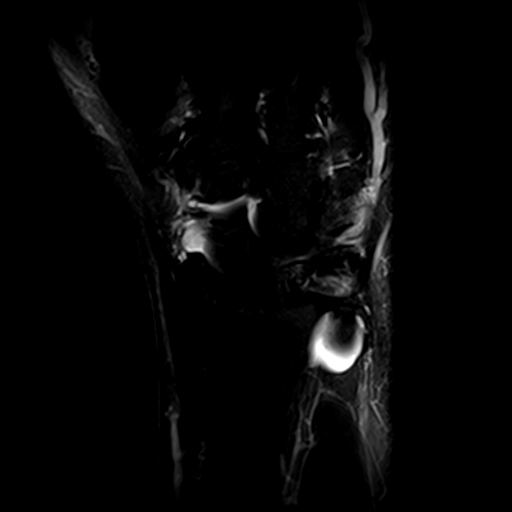
[im 7/14]
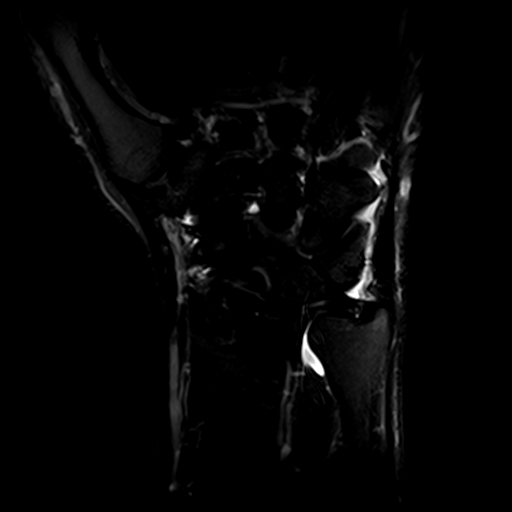
[im 9/14]
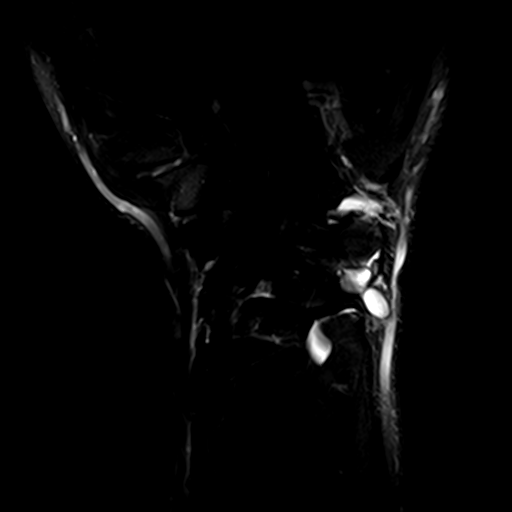
[im 11/14]
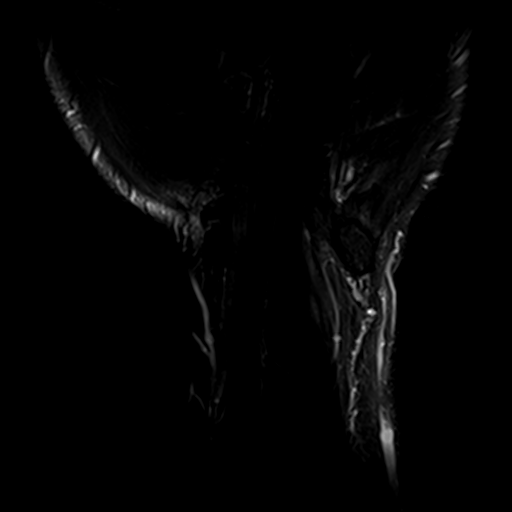
[im 14/14]
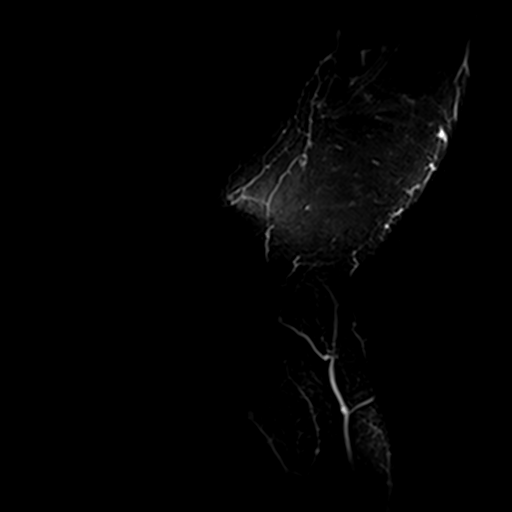

[21 of 40 positions shown; findings below may reference images not displayed]

FINDINGS: Alignment
Ulnar variance:  None

Distal radioulnar joint Congruent.

Carpal instability: Dorsal tilting of the lunate which may be
positional versus secondary to DISI.

Fluid
Carpus effusion: Arthrographic contrast is present within the
radiocarpal joint, midcarpal joint and distal radioulnar joint.

Intrinsic ligaments
Scapholunate ligament: Small perforation of the central portion of
the scapholunate ligament. Dorsal and volar bands of the
scapholunate ligament are intact.

Lunotriquetral ligament:  Intact.

Triangular fibrocartilage: Small central perforation through the
central portion of the articular disc.

Lunate facet:  No

Extensor compartment
I:  Normal.

II:  Normal.

III:  Normal.

IV:  Normal.

V:  Normal.

VI:  Normal.

Flexor compartment
Carpal tunnel:  Normal.

Median nerve:  Normal.

Flexor retinaculum:  Normal.

Flexor tendons:  Normal.

[REDACTED]:  Normal.

Articulations
Thumb carpometacarpal joint:  Normal.

Scaphotrapezotrapezoidal joint:  Normal.

Pisiform-triquetral joint:  Normal.

Other findings
Bones:  Mild osteoarthritis of the first CMC joint.

Muscles:  Normal.

Vessels:  Normal.
IMPRESSION: 1. Small central perforation through the central portion of the
articular disc of the triangular fibrocartilage complex.
2. Small perforation of the central portion of the scapholunate
ligament. The dorsal and volar bands of the scapholunate ligament
are intact.
3. Dorsal tilting of the lunate which may be positional versus
secondary to DISI.

## 2017-03-29 DIAGNOSIS — H401111 Primary open-angle glaucoma, right eye, mild stage: Secondary | ICD-10-CM | POA: Diagnosis not present

## 2017-03-29 DIAGNOSIS — H5213 Myopia, bilateral: Secondary | ICD-10-CM | POA: Diagnosis not present

## 2017-07-11 DIAGNOSIS — H401131 Primary open-angle glaucoma, bilateral, mild stage: Secondary | ICD-10-CM | POA: Diagnosis not present

## 2017-10-31 DIAGNOSIS — N529 Male erectile dysfunction, unspecified: Secondary | ICD-10-CM | POA: Diagnosis not present

## 2017-10-31 DIAGNOSIS — R7301 Impaired fasting glucose: Secondary | ICD-10-CM | POA: Diagnosis not present

## 2017-10-31 DIAGNOSIS — E7800 Pure hypercholesterolemia, unspecified: Secondary | ICD-10-CM | POA: Diagnosis not present

## 2017-10-31 DIAGNOSIS — E291 Testicular hypofunction: Secondary | ICD-10-CM | POA: Diagnosis not present

## 2018-01-11 DIAGNOSIS — H5213 Myopia, bilateral: Secondary | ICD-10-CM | POA: Diagnosis not present

## 2018-07-20 DIAGNOSIS — E7800 Pure hypercholesterolemia, unspecified: Secondary | ICD-10-CM | POA: Diagnosis not present

## 2018-07-23 DIAGNOSIS — R7301 Impaired fasting glucose: Secondary | ICD-10-CM | POA: Diagnosis not present

## 2018-07-23 DIAGNOSIS — E7800 Pure hypercholesterolemia, unspecified: Secondary | ICD-10-CM | POA: Diagnosis not present

## 2018-07-23 DIAGNOSIS — R739 Hyperglycemia, unspecified: Secondary | ICD-10-CM | POA: Diagnosis not present

## 2018-07-23 DIAGNOSIS — N529 Male erectile dysfunction, unspecified: Secondary | ICD-10-CM | POA: Diagnosis not present

## 2018-11-21 DIAGNOSIS — M25552 Pain in left hip: Secondary | ICD-10-CM | POA: Diagnosis not present

## 2019-01-01 DIAGNOSIS — Z471 Aftercare following joint replacement surgery: Secondary | ICD-10-CM | POA: Diagnosis not present

## 2019-01-01 DIAGNOSIS — Z96642 Presence of left artificial hip joint: Secondary | ICD-10-CM | POA: Diagnosis not present

## 2019-01-01 DIAGNOSIS — M1612 Unilateral primary osteoarthritis, left hip: Secondary | ICD-10-CM | POA: Diagnosis not present

## 2019-01-01 DIAGNOSIS — E785 Hyperlipidemia, unspecified: Secondary | ICD-10-CM | POA: Diagnosis not present

## 2019-01-01 DIAGNOSIS — M6281 Muscle weakness (generalized): Secondary | ICD-10-CM | POA: Diagnosis not present

## 2019-01-01 DIAGNOSIS — R262 Difficulty in walking, not elsewhere classified: Secondary | ICD-10-CM | POA: Diagnosis not present

## 2019-01-01 DIAGNOSIS — M1712 Unilateral primary osteoarthritis, left knee: Secondary | ICD-10-CM | POA: Diagnosis not present

## 2019-01-03 DIAGNOSIS — Z471 Aftercare following joint replacement surgery: Secondary | ICD-10-CM | POA: Diagnosis not present

## 2019-01-03 DIAGNOSIS — Z96642 Presence of left artificial hip joint: Secondary | ICD-10-CM | POA: Diagnosis not present

## 2019-01-03 DIAGNOSIS — R600 Localized edema: Secondary | ICD-10-CM | POA: Diagnosis not present

## 2019-01-03 DIAGNOSIS — M25652 Stiffness of left hip, not elsewhere classified: Secondary | ICD-10-CM | POA: Diagnosis not present

## 2019-01-08 DIAGNOSIS — M25652 Stiffness of left hip, not elsewhere classified: Secondary | ICD-10-CM | POA: Diagnosis not present

## 2019-01-08 DIAGNOSIS — Z96642 Presence of left artificial hip joint: Secondary | ICD-10-CM | POA: Diagnosis not present

## 2019-01-08 DIAGNOSIS — R600 Localized edema: Secondary | ICD-10-CM | POA: Diagnosis not present

## 2019-01-08 DIAGNOSIS — Z471 Aftercare following joint replacement surgery: Secondary | ICD-10-CM | POA: Diagnosis not present

## 2019-01-15 DIAGNOSIS — R600 Localized edema: Secondary | ICD-10-CM | POA: Diagnosis not present

## 2019-01-15 DIAGNOSIS — Z471 Aftercare following joint replacement surgery: Secondary | ICD-10-CM | POA: Diagnosis not present

## 2019-01-15 DIAGNOSIS — Z96642 Presence of left artificial hip joint: Secondary | ICD-10-CM | POA: Diagnosis not present

## 2019-01-15 DIAGNOSIS — M25652 Stiffness of left hip, not elsewhere classified: Secondary | ICD-10-CM | POA: Diagnosis not present

## 2019-01-24 DIAGNOSIS — Z96642 Presence of left artificial hip joint: Secondary | ICD-10-CM | POA: Diagnosis not present

## 2019-01-24 DIAGNOSIS — R600 Localized edema: Secondary | ICD-10-CM | POA: Diagnosis not present

## 2019-01-24 DIAGNOSIS — M25652 Stiffness of left hip, not elsewhere classified: Secondary | ICD-10-CM | POA: Diagnosis not present

## 2019-01-24 DIAGNOSIS — Z471 Aftercare following joint replacement surgery: Secondary | ICD-10-CM | POA: Diagnosis not present

## 2019-02-05 DIAGNOSIS — R600 Localized edema: Secondary | ICD-10-CM | POA: Diagnosis not present

## 2019-02-05 DIAGNOSIS — Z96642 Presence of left artificial hip joint: Secondary | ICD-10-CM | POA: Diagnosis not present

## 2019-02-05 DIAGNOSIS — Z471 Aftercare following joint replacement surgery: Secondary | ICD-10-CM | POA: Diagnosis not present

## 2019-02-05 DIAGNOSIS — M25652 Stiffness of left hip, not elsewhere classified: Secondary | ICD-10-CM | POA: Diagnosis not present

## 2019-02-07 DIAGNOSIS — Z96642 Presence of left artificial hip joint: Secondary | ICD-10-CM | POA: Diagnosis not present

## 2019-03-27 DIAGNOSIS — Z96641 Presence of right artificial hip joint: Secondary | ICD-10-CM | POA: Diagnosis not present

## 2019-04-18 DIAGNOSIS — Z96642 Presence of left artificial hip joint: Secondary | ICD-10-CM | POA: Diagnosis not present

## 2019-04-18 DIAGNOSIS — M25552 Pain in left hip: Secondary | ICD-10-CM | POA: Diagnosis not present

## 2019-04-24 DIAGNOSIS — M8949 Other hypertrophic osteoarthropathy, multiple sites: Secondary | ICD-10-CM | POA: Diagnosis not present

## 2019-04-24 DIAGNOSIS — N5201 Erectile dysfunction due to arterial insufficiency: Secondary | ICD-10-CM | POA: Diagnosis not present

## 2019-04-24 DIAGNOSIS — E782 Mixed hyperlipidemia: Secondary | ICD-10-CM | POA: Diagnosis not present

## 2019-04-24 DIAGNOSIS — Z125 Encounter for screening for malignant neoplasm of prostate: Secondary | ICD-10-CM | POA: Diagnosis not present

## 2019-07-09 DIAGNOSIS — M25552 Pain in left hip: Secondary | ICD-10-CM | POA: Diagnosis not present

## 2019-07-09 DIAGNOSIS — Z96642 Presence of left artificial hip joint: Secondary | ICD-10-CM | POA: Diagnosis not present

## 2019-07-16 DIAGNOSIS — I8290 Acute embolism and thrombosis of unspecified vein: Secondary | ICD-10-CM | POA: Diagnosis not present

## 2019-07-16 DIAGNOSIS — M79604 Pain in right leg: Secondary | ICD-10-CM | POA: Diagnosis not present

## 2019-07-16 DIAGNOSIS — M79674 Pain in right toe(s): Secondary | ICD-10-CM | POA: Diagnosis not present

## 2019-07-16 DIAGNOSIS — I8001 Phlebitis and thrombophlebitis of superficial vessels of right lower extremity: Secondary | ICD-10-CM | POA: Diagnosis not present

## 2019-07-16 DIAGNOSIS — M79671 Pain in right foot: Secondary | ICD-10-CM | POA: Diagnosis not present

## 2019-07-16 DIAGNOSIS — M7989 Other specified soft tissue disorders: Secondary | ICD-10-CM | POA: Diagnosis not present

## 2019-07-16 DIAGNOSIS — M19071 Primary osteoarthritis, right ankle and foot: Secondary | ICD-10-CM | POA: Diagnosis not present

## 2019-07-17 DIAGNOSIS — S85301A Unspecified injury of greater saphenous vein at lower leg level, right leg, initial encounter: Secondary | ICD-10-CM | POA: Diagnosis not present

## 2019-07-17 DIAGNOSIS — I82811 Embolism and thrombosis of superficial veins of right lower extremities: Secondary | ICD-10-CM | POA: Diagnosis not present

## 2019-07-17 DIAGNOSIS — M79604 Pain in right leg: Secondary | ICD-10-CM | POA: Diagnosis not present

## 2020-02-04 ENCOUNTER — Encounter: Payer: Self-pay | Admitting: *Deleted

## 2020-02-05 ENCOUNTER — Other Ambulatory Visit: Payer: Self-pay

## 2020-02-05 ENCOUNTER — Encounter: Payer: Self-pay | Admitting: Dermatology

## 2020-02-05 ENCOUNTER — Ambulatory Visit (INDEPENDENT_AMBULATORY_CARE_PROVIDER_SITE_OTHER): Payer: BLUE CROSS/BLUE SHIELD | Admitting: Dermatology

## 2020-02-05 DIAGNOSIS — D485 Neoplasm of uncertain behavior of skin: Secondary | ICD-10-CM | POA: Diagnosis not present

## 2020-02-05 DIAGNOSIS — D229 Melanocytic nevi, unspecified: Secondary | ICD-10-CM

## 2020-02-05 DIAGNOSIS — L821 Other seborrheic keratosis: Secondary | ICD-10-CM | POA: Diagnosis not present

## 2020-02-05 DIAGNOSIS — D1801 Hemangioma of skin and subcutaneous tissue: Secondary | ICD-10-CM

## 2020-02-05 DIAGNOSIS — Z1283 Encounter for screening for malignant neoplasm of skin: Secondary | ICD-10-CM | POA: Diagnosis not present

## 2020-02-05 DIAGNOSIS — D225 Melanocytic nevi of trunk: Secondary | ICD-10-CM

## 2020-02-05 HISTORY — DX: Melanocytic nevi, unspecified: D22.9

## 2020-02-05 NOTE — Patient Instructions (Addendum)
Biopsy, Surgery (Curettage) & Surgery (Excision) Aftercare Instructions  1. Okay to remove bandage in 24 hours  2. Wash area with soap and water  3. Apply Vaseline to area twice daily until healed (Not Neosporin)  4. Okay to cover with a Band-Aid to decrease the chance of infection or prevent irritation from clothing; also it's okay to uncover lesion at home.  5. Suture instructions: return to our office in 7-10 or 10-14 days for a nurse visit for suture removal. Variable healing with sutures, if pain or itching occurs call our office. It's okay to shower or bathe 24 hours after sutures are given.  6. The following risks may occur after a biopsy, curettage or excision: bleeding, scarring, discoloration, recurrence, infection (redness, yellow drainage, pain or swelling).  7. For questions, concerns and results call our office at [place] before 4pm & Friday before 3pm. Biopsy results will be available in 1 week.  Routine follow-up(first in over three years)  for ARCHIE SHEA date of birth May 27, 1956.  The melanoma scar on his left upper arm is perfectly clear and there is no regional increase in lymph nodes.  Skin examined from his legs up to his scalp.  There is 1 bi-chromic irregular mole on the left lower back which shows some atypia with dermoscopy.  Shave biopsy obtained on this and he could check on his home MyChart on Friday for the result or he can call my office.  He is scheduled to travel to the beach and a pre-warned him about irritation from his back rubbing on the car seat so a simple bandage may minimize irritation.  My cell phone is [phone] if there is any problem in which he needs to contact me.  Additionally he has 3 types of common benign spots.  On rub areas like the neck he has little stuck out bumps called skin tags which require no intervention.  He has multiple tan textured keratoses including spot on the right sideburn.  In the right sideburn and on the upper  frontal hairline are similar rough spots which are more likely warts than keratoses.  These are currently of no concern or bother to Mr. Wolfinger so no treatment initiated.  Finally he has many dozen 2 to 3 mm red dots, larger ones on the right front thigh, which are benign cherry angiomas and do not require watching her removal.  If the microscopic on his mole indicates no need for further therapy, I will do an annual skin check on Mr. Klarich

## 2020-02-11 ENCOUNTER — Telehealth: Payer: Self-pay | Admitting: *Deleted

## 2020-02-11 ENCOUNTER — Encounter: Payer: Self-pay | Admitting: *Deleted

## 2020-02-11 NOTE — Telephone Encounter (Signed)
Left message for patient to return our call.

## 2020-02-11 NOTE — Telephone Encounter (Signed)
-----   Message from Lavonna Monarch, MD sent at 02/11/2020  6:25 AM EDT ----- Moderate DN with positive deep margin, scheduled routine follow-up visit for wider shave.

## 2020-02-12 NOTE — Telephone Encounter (Signed)
Phone call from patient returning our call regarding his pathology results.  Pathology results given to patient.

## 2020-02-12 NOTE — Telephone Encounter (Signed)
-----   Message from Lavonna Monarch, MD sent at 02/11/2020  6:25 AM EDT ----- Moderate DN with positive deep margin, scheduled routine follow-up visit for wider shave.

## 2020-03-02 ENCOUNTER — Encounter: Payer: Self-pay | Admitting: Dermatology

## 2020-03-02 NOTE — Progress Notes (Signed)
   New Patient   Subjective  Jason Villegas is a 64 y.o. male who presents for the following: Annual Exam (no areas of concern no problems).  Moles Location: All over Duration:  Quality:  Associated Signs/Symptoms: Modifying Factors:  Severity:  Timing: Context: History of melanoma right arm   The following portions of the chart were reviewed this encounter and updated as appropriate:     Objective  Well appearing patient in no apparent distress; mood and affect are within normal limits.  A full examination was performed including scalp, head, eyes, ears, nose, lips, neck, chest, axillae, abdomen, back, buttocks, bilateral upper extremities, bilateral lower extremities, hands, feet, fingers, toes, fingernails, and toenails. All findings within normal limits unless otherwise noted below.   Assessment & Plan  Neoplasm of uncertain behavior of skin Left Lower Back  Skin / nail biopsy Type of biopsy: tangential   Timeout: patient name, date of birth, surgical site, and procedure verified   Instrument used: flexible razor blade   Hemostasis achieved with: ferric subsulfate   Outcome: patient tolerated procedure well   Post-procedure details: wound care instructions given    Specimen 1 - Surgical pathology Differential Diagnosis: atypia Check Margins: No  Nevus Mid Back  Annual skin examination.  Seborrheic keratosis Right Zygomatic Area  Leave if stable Routine follow-up(first in over three years)  for Jason Villegas date of birth October 16, 1955.  The melanoma scar on his left upper arm is perfectly clear and there is no regional increase in lymph nodes.  Skin examined from his legs up to his scalp.  There is 1 bi-chromic irregular mole on the left lower back which shows some atypia with dermoscopy.  Shave biopsy obtained on this and he could check on his home MyChart on Friday for the result or he can call my office.  He is scheduled to travel to the beach and a pre-warned  him about irritation from his back rubbing on the car seat so a simple bandage may minimize irritation.  My cell phone is [phone] if there is any problem in which he needs to contact me.  Additionally he has 3 types of common benign spots.  On rub areas like the neck he has little stuck out bumps called skin tags which require no intervention.  He has multiple tan textured keratoses including spot on the right sideburn.  In the right sideburn and on the upper frontal hairline are similar rough spots which are more likely warts than keratoses.  These are currently of no concern or bother to Jason Villegas so no treatment initiated.  Finally he has many dozen 2 to 3 mm red dots, larger ones on the right front thigh, which are benign cherry angiomas and do not require watching her removal.  If the microscopic on his mole indicates no need for further therapy, I will do an annual skin check on Jason Villegas. Skin cancer screening performed today.

## 2020-03-26 ENCOUNTER — Ambulatory Visit (INDEPENDENT_AMBULATORY_CARE_PROVIDER_SITE_OTHER): Payer: BLUE CROSS/BLUE SHIELD | Admitting: Dermatology

## 2020-03-26 ENCOUNTER — Other Ambulatory Visit: Payer: Self-pay

## 2020-03-26 ENCOUNTER — Encounter: Payer: Self-pay | Admitting: Dermatology

## 2020-03-26 DIAGNOSIS — D235 Other benign neoplasm of skin of trunk: Secondary | ICD-10-CM

## 2020-03-26 DIAGNOSIS — D239 Other benign neoplasm of skin, unspecified: Secondary | ICD-10-CM

## 2020-05-04 ENCOUNTER — Encounter: Payer: Self-pay | Admitting: Dermatology

## 2020-05-04 NOTE — Progress Notes (Signed)
   Follow-Up Visit   Subjective  Jason Villegas is a 64 y.o. male who presents for the following: Procedure (widershaving---lower left side back.).  Dysplastic nevus Location: Left lower back Duration:  Quality:  Associated Signs/Symptoms: Modifying Factors: Positive deep margin in a patient with history of melanoma Severity:  Timing: Context: For wider, deeper shave  Objective  Well appearing patient in no apparent distress; mood and affect are within normal limits.  All skin waist up examined.   Assessment & Plan    Dysplastic nevus Left Lower Back  Wider deeper shave with approximately 1.5 mm margins.  Hemostasis with light cautery. Lesion with margins 1.2cm.  Skin / nail biopsy - Left Lower Back Type of biopsy: tangential   Informed consent: discussed and consent obtained   Timeout: patient name, date of birth, surgical site, and procedure verified   Procedure prep:  Patient was prepped and draped in usual sterile fashion Prep type:  Isopropyl alcohol Anesthesia: the lesion was anesthetized in a standard fashion   Anesthetic:  1% lidocaine w/ epinephrine 1-100,000 buffered w/ 8.4% NaHCO3 Instrument used: flexible razor blade   Hemostasis achieved with: pressure, aluminum chloride and electrodesiccation   Outcome: patient tolerated procedure well   Post-procedure details: sterile dressing applied and wound care instructions given   Dressing type: bandage and petrolatum    Specimen 1 - Surgical pathology Differential Diagnosis: Dysplastic nevus Check Margins: Yes [id number]     I, Lavonna Monarch, MD, have reviewed all documentation for this visit.  The documentation on 05/04/20 for the exam, diagnosis, procedures, and orders are all accurate and complete.
